# Patient Record
Sex: Male | Born: 1975 | Race: White | Hispanic: No | Smoking: Current every day smoker
Health system: Southern US, Community
[De-identification: ages and names within clinical notes are randomized; demographics above are authoritative.]

## PROBLEM LIST (undated history)

## (undated) DIAGNOSIS — G709 Myoneural disorder, unspecified: Secondary | ICD-10-CM

## (undated) DIAGNOSIS — G4730 Sleep apnea, unspecified: Secondary | ICD-10-CM

## (undated) DIAGNOSIS — I10 Essential (primary) hypertension: Secondary | ICD-10-CM

## (undated) DIAGNOSIS — K219 Gastro-esophageal reflux disease without esophagitis: Secondary | ICD-10-CM

## (undated) DIAGNOSIS — M1990 Unspecified osteoarthritis, unspecified site: Secondary | ICD-10-CM

## (undated) HISTORY — PX: MYRINGOTOMY WITH TUBE PLACEMENT: SHX5663

## (undated) HISTORY — PX: TONSILLECTOMY: SUR1361

## (undated) HISTORY — PX: ADENOIDECTOMY: SUR15

---

## 2006-02-07 ENCOUNTER — Encounter: Admission: RE | Admit: 2006-02-07 | Discharge: 2006-02-07 | Payer: Self-pay | Admitting: Unknown Physician Specialty

## 2013-02-17 ENCOUNTER — Other Ambulatory Visit: Payer: Self-pay | Admitting: Orthopedic Surgery

## 2013-02-17 DIAGNOSIS — M25561 Pain in right knee: Secondary | ICD-10-CM

## 2013-02-22 ENCOUNTER — Ambulatory Visit
Admission: RE | Admit: 2013-02-22 | Discharge: 2013-02-22 | Disposition: A | Discharge status: Home / Self Care | Payer: 59 | Source: Ambulatory Visit | Attending: Orthopedic Surgery | Admitting: Orthopedic Surgery

## 2013-02-22 DIAGNOSIS — M25561 Pain in right knee: Secondary | ICD-10-CM

## 2013-02-27 ENCOUNTER — Other Ambulatory Visit: Payer: Self-pay | Admitting: Orthopedic Surgery

## 2013-02-27 ENCOUNTER — Encounter (HOSPITAL_COMMUNITY): Payer: Self-pay | Admitting: *Deleted

## 2013-03-02 ENCOUNTER — Encounter (HOSPITAL_COMMUNITY)
Admission: RE | Disposition: A | Discharge status: Home / Self Care | Payer: Self-pay | Source: Ambulatory Visit | Attending: Orthopedic Surgery

## 2013-03-02 ENCOUNTER — Ambulatory Visit (HOSPITAL_COMMUNITY)
Admission: RE | Admit: 2013-03-02 | Discharge: 2013-03-02 | Disposition: A | Discharge status: Home / Self Care | Payer: 59 | Source: Ambulatory Visit | Attending: Orthopedic Surgery | Admitting: Orthopedic Surgery

## 2013-03-02 ENCOUNTER — Encounter (HOSPITAL_COMMUNITY): Payer: Self-pay | Admitting: *Deleted

## 2013-03-02 ENCOUNTER — Ambulatory Visit (HOSPITAL_COMMUNITY): Payer: 59

## 2013-03-02 ENCOUNTER — Ambulatory Visit (HOSPITAL_COMMUNITY): Payer: 59 | Admitting: Anesthesiology

## 2013-03-02 ENCOUNTER — Encounter (HOSPITAL_COMMUNITY): Payer: Self-pay | Admitting: Anesthesiology

## 2013-03-02 DIAGNOSIS — X58XXXA Exposure to other specified factors, initial encounter: Secondary | ICD-10-CM | POA: Insufficient documentation

## 2013-03-02 DIAGNOSIS — Y9301 Activity, walking, marching and hiking: Secondary | ICD-10-CM | POA: Insufficient documentation

## 2013-03-02 DIAGNOSIS — Z6841 Body Mass Index (BMI) 40.0 and over, adult: Secondary | ICD-10-CM | POA: Insufficient documentation

## 2013-03-02 DIAGNOSIS — M2240 Chondromalacia patellae, unspecified knee: Secondary | ICD-10-CM | POA: Insufficient documentation

## 2013-03-02 DIAGNOSIS — M1710 Unilateral primary osteoarthritis, unspecified knee: Secondary | ICD-10-CM | POA: Insufficient documentation

## 2013-03-02 DIAGNOSIS — IMO0002 Reserved for concepts with insufficient information to code with codable children: Secondary | ICD-10-CM | POA: Insufficient documentation

## 2013-03-02 DIAGNOSIS — Z9889 Other specified postprocedural states: Secondary | ICD-10-CM

## 2013-03-02 DIAGNOSIS — I10 Essential (primary) hypertension: Secondary | ICD-10-CM | POA: Insufficient documentation

## 2013-03-02 HISTORY — PX: KNEE ARTHROSCOPY: SHX127

## 2013-03-02 HISTORY — DX: Sleep apnea, unspecified: G47.30

## 2013-03-02 HISTORY — DX: Unspecified osteoarthritis, unspecified site: M19.90

## 2013-03-02 HISTORY — DX: Gastro-esophageal reflux disease without esophagitis: K21.9

## 2013-03-02 HISTORY — DX: Essential (primary) hypertension: I10

## 2013-03-02 HISTORY — DX: Myoneural disorder, unspecified: G70.9

## 2013-03-02 LAB — CBC
HCT: 44.2 % (ref 39.0–52.0)
Hemoglobin: 14.8 g/dL (ref 13.0–17.0)
RBC: 4.96 MIL/uL (ref 4.22–5.81)
RDW: 14.2 % (ref 11.5–15.5)
WBC: 8.4 10*3/uL (ref 4.0–10.5)

## 2013-03-02 LAB — BASIC METABOLIC PANEL
BUN: 13 mg/dL (ref 6–23)
Chloride: 104 mEq/L (ref 96–112)
Creatinine, Ser: 0.86 mg/dL (ref 0.50–1.35)
GFR calc Af Amer: >90 mL/min (ref >90)
Potassium: 4 mEq/L (ref 3.5–5.1)
Sodium: 138 mEq/L (ref 135–145)

## 2013-03-02 SURGERY — ARTHROSCOPY, KNEE
Anesthesia: General | Site: Knee | Laterality: Right | Wound class: Clean

## 2013-03-02 MED ORDER — PROPOFOL 10 MG/ML IV BOLUS
INTRAVENOUS | Status: DC | PRN
  Administered 2013-03-02: 80 mg via INTRAVENOUS
  Administered 2013-03-02: 400 mg via INTRAVENOUS

## 2013-03-02 MED ORDER — OXYCODONE HCL 5 MG/5ML PO SOLN: 5.0000 mg | Freq: Once | ORAL | Status: AC | PRN

## 2013-03-02 MED ORDER — HYDROCODONE-ACETAMINOPHEN 5-325 MG PO TABS
ORAL_TABLET | ORAL | Status: AC
  Filled 2013-03-02: qty 2

## 2013-03-02 MED ORDER — LIDOCAINE HCL (CARDIAC) 20 MG/ML IV SOLN
INTRAVENOUS | Status: DC | PRN
  Administered 2013-03-02: 100 mg via INTRAVENOUS

## 2013-03-02 MED ORDER — ROCURONIUM BROMIDE 100 MG/10ML IV SOLN
INTRAVENOUS | Status: DC | PRN
  Administered 2013-03-02: 20 mg via INTRAVENOUS

## 2013-03-02 MED ORDER — LACTATED RINGERS IV SOLN
INTRAVENOUS | Status: DC
  Administered 2013-03-02: 10:00:00 via INTRAVENOUS

## 2013-03-02 MED ORDER — NEOSTIGMINE METHYLSULFATE 1 MG/ML IJ SOLN
INTRAMUSCULAR | Status: DC | PRN
  Administered 2013-03-02: 3 mg via INTRAVENOUS

## 2013-03-02 MED ORDER — MIDAZOLAM HCL 5 MG/5ML IJ SOLN
INTRAMUSCULAR | Status: DC | PRN
  Administered 2013-03-02: 2 mg via INTRAVENOUS

## 2013-03-02 MED ORDER — LACTATED RINGERS IV SOLN
INTRAVENOUS | Status: DC | PRN
  Administered 2013-03-02 (×2): via INTRAVENOUS

## 2013-03-02 MED ORDER — GLYCOPYRROLATE 0.2 MG/ML IJ SOLN
INTRAMUSCULAR | Status: DC | PRN
  Administered 2013-03-02: 0.4 mg via INTRAVENOUS

## 2013-03-02 MED ORDER — ONDANSETRON HCL 4 MG/2ML IJ SOLN
INTRAMUSCULAR | Status: DC | PRN
  Administered 2013-03-02: 4 mg via INTRAVENOUS

## 2013-03-02 MED ORDER — ONDANSETRON HCL 4 MG/2ML IJ SOLN: 4.0000 mg | Freq: Once | INTRAMUSCULAR | Status: DC | PRN

## 2013-03-02 MED ORDER — SODIUM CHLORIDE 0.9 % IR SOLN
Status: DC | PRN
  Administered 2013-03-02 (×2): 3000 mL

## 2013-03-02 MED ORDER — CHLORHEXIDINE GLUCONATE 4 % EX LIQD: 60.0000 mL | Freq: Once | CUTANEOUS | Status: DC

## 2013-03-02 MED ORDER — FENTANYL CITRATE 0.05 MG/ML IJ SOLN
INTRAMUSCULAR | Status: DC | PRN
  Administered 2013-03-02: 150 ug via INTRAVENOUS
  Administered 2013-03-02 (×2): 50 ug via INTRAVENOUS

## 2013-03-02 MED ORDER — HYDROCODONE-ACETAMINOPHEN 7.5-325 MG PO TABS: 1.0000 | ORAL_TABLET | Freq: Four times a day (QID) | ORAL | Status: DC | PRN

## 2013-03-02 MED ORDER — OXYCODONE HCL 5 MG PO TABS
ORAL_TABLET | ORAL | Status: AC
  Filled 2013-03-02: qty 1

## 2013-03-02 MED ORDER — HYDROCODONE-ACETAMINOPHEN 5-325 MG PO TABS
2.0000 | ORAL_TABLET | ORAL | Status: AC
  Administered 2013-03-02: 2 via ORAL

## 2013-03-02 MED ORDER — SUCCINYLCHOLINE CHLORIDE 20 MG/ML IJ SOLN
INTRAMUSCULAR | Status: DC | PRN
  Administered 2013-03-02: 200 mg via INTRAVENOUS

## 2013-03-02 MED ORDER — HYDROMORPHONE HCL PF 1 MG/ML IJ SOLN: 0.2500 mg | INTRAMUSCULAR | Status: DC | PRN

## 2013-03-02 MED ORDER — OXYCODONE HCL 5 MG PO TABS
5.0000 mg | ORAL_TABLET | Freq: Once | ORAL | Status: AC | PRN
  Administered 2013-03-02: 5 mg via ORAL

## 2013-03-02 MED ORDER — BUPIVACAINE-EPINEPHRINE 0.5% -1:200000 IJ SOLN
INTRAMUSCULAR | Status: DC | PRN
  Administered 2013-03-02: 15 mL

## 2013-03-02 SURGICAL SUPPLY — 50 items
BANDAGE ELASTIC 6 VELCRO ST LF (GAUZE/BANDAGES/DRESSINGS) ×2 IMPLANT
BANDAGE ESMARK 6X9 LF (GAUZE/BANDAGES/DRESSINGS) IMPLANT
BLADE CUDA 5.5 (BLADE) IMPLANT
BLADE CUTTER GATOR 3.5 (BLADE) IMPLANT
BLADE GREAT WHITE 4.2 (BLADE) ×2 IMPLANT
BNDG CMPR 9X6 STRL LF SNTH (GAUZE/BANDAGES/DRESSINGS)
BNDG COHESIVE 6X5 TAN NS LF (GAUZE/BANDAGES/DRESSINGS) ×1 IMPLANT
BNDG ESMARK 6X9 LF (GAUZE/BANDAGES/DRESSINGS)
BUR OVAL 6.0 (BURR) IMPLANT
CLOTH BEACON ORANGE TIMEOUT ST (SAFETY) ×1 IMPLANT
CUFF TOURNIQUET SINGLE 34IN LL (TOURNIQUET CUFF) IMPLANT
DRAPE ARTHROSCOPY W/POUCH 114 (DRAPES) ×2 IMPLANT
DRAPE U-SHAPE 47X51 STRL (DRAPES) IMPLANT
ELECT CAUTERY BLADE 6.4 (BLADE) IMPLANT
ELECT MENISCUS 165MM 90D (ELECTRODE) IMPLANT
ELECT REM PT RETURN 9FT ADLT (ELECTROSURGICAL)
ELECTRODE REM PT RTRN 9FT ADLT (ELECTROSURGICAL) IMPLANT
GAUZE XEROFORM 1X8 LF (GAUZE/BANDAGES/DRESSINGS) ×2 IMPLANT
GLOVE BIOGEL M 7.0 STRL (GLOVE) ×2 IMPLANT
GLOVE BIOGEL PI IND STRL 7.0 (GLOVE) ×1 IMPLANT
GLOVE BIOGEL PI IND STRL 7.5 (GLOVE) IMPLANT
GLOVE BIOGEL PI IND STRL 8.5 (GLOVE) ×1 IMPLANT
GLOVE BIOGEL PI INDICATOR 7.0 (GLOVE) ×1
GLOVE BIOGEL PI INDICATOR 7.5 (GLOVE) ×2
GLOVE BIOGEL PI INDICATOR 8.5 (GLOVE) ×1
GLOVE ECLIPSE 6.5 STRL STRAW (GLOVE) ×1 IMPLANT
GLOVE SURG ORTHO 8.0 STRL STRW (GLOVE) ×3 IMPLANT
GOWN BRE IMP SLV AUR XL STRL (GOWN DISPOSABLE) ×4 IMPLANT
GOWN SRG XL XLNG 56XLVL 4 (GOWN DISPOSABLE) IMPLANT
GOWN STRL NON-REIN LRG LVL3 (GOWN DISPOSABLE) ×3 IMPLANT
GOWN STRL NON-REIN XL XLG LVL4 (GOWN DISPOSABLE) ×4
KIT ROOM TURNOVER OR (KITS) ×2 IMPLANT
MANIFOLD NEPTUNE II (INSTRUMENTS) ×2 IMPLANT
NDL 18GX1X1/2 (RX/OR ONLY) (NEEDLE) ×1 IMPLANT
NEEDLE 18GX1X1/2 (RX/OR ONLY) (NEEDLE) ×2 IMPLANT
PACK ARTHROSCOPY DSU (CUSTOM PROCEDURE TRAY) ×2 IMPLANT
PAD ARMBOARD 7.5X6 YLW CONV (MISCELLANEOUS) ×4 IMPLANT
PADDING CAST COTTON 6X4 STRL (CAST SUPPLIES) ×1 IMPLANT
PENCIL BUTTON HOLSTER BLD 10FT (ELECTRODE) IMPLANT
SET ARTHROSCOPY TUBING (MISCELLANEOUS) ×2
SET ARTHROSCOPY TUBING LN (MISCELLANEOUS) ×1 IMPLANT
SPONGE GAUZE 4X4 12PLY (GAUZE/BANDAGES/DRESSINGS) ×2 IMPLANT
SPONGE LAP 4X18 X RAY DECT (DISPOSABLE) ×2 IMPLANT
SUT ETHILON 4 0 PS 2 18 (SUTURE) ×2 IMPLANT
SYR 30ML LL (SYRINGE) ×4 IMPLANT
TOWEL OR 17X24 6PK STRL BLUE (TOWEL DISPOSABLE) ×2 IMPLANT
TOWEL OR 17X26 10 PK STRL BLUE (TOWEL DISPOSABLE) ×2 IMPLANT
TUBE CONNECTING 12X1/4 (SUCTIONS) ×2 IMPLANT
WAND 90 DEG TURBOVAC W/CORD (SURGICAL WAND) ×1 IMPLANT
WATER STERILE IRR 1000ML POUR (IV SOLUTION) ×2 IMPLANT

## 2013-03-02 NOTE — Transfer of Care (Signed)
Immediate Anesthesia Transfer of Care Note  Patient: Juan Dixon  Procedure(s) Performed: Procedure(s): ARTHROSCOPY KNEE (Right)  Patient Location: PACU  Anesthesia Type:General  Level of Consciousness: awake, alert , oriented and patient cooperative  Airway & Oxygen Therapy: Patient Spontanous Breathing and Patient connected to nasal cannula oxygen  Post-op Assessment: Report given to PACU RN and Post -op Vital signs reviewed and stable  Post vital signs: Reviewed  Complications: No apparent anesthesia complications

## 2013-03-02 NOTE — Anesthesia Preprocedure Evaluation (Addendum)
Anesthesia Evaluation  Patient identified by MRN, date of birth, ID band Patient awake    Reviewed: Allergy & Precautions, H&P , NPO status , Patient's Chart, lab work & pertinent test results  History of Anesthesia Complications Negative for: history of anesthetic complications  Airway Mallampati: II TM Distance: >3 FB Neck ROM: Full    Dental  (+) Teeth Intact and Dental Advisory Given   Pulmonary sleep apnea and Continuous Positive Airway Pressure Ventilation , Current Smoker,  1 ppd x 20 years breath sounds clear to auscultation        Cardiovascular hypertension, Pt. on medications Rhythm:Regular Rate:Normal     Neuro/Psych    GI/Hepatic Neg liver ROS, GERD-  Controlled,  Endo/Other  Morbid obesity  Renal/GU negative Renal ROS     Musculoskeletal   Abdominal   Peds  Hematology negative hematology ROS (+)   Anesthesia Other Findings   Reproductive/Obstetrics negative OB ROS                          Anesthesia Physical Anesthesia Plan  ASA: III  Anesthesia Plan: General   Post-op Pain Management:    Induction: Intravenous  Airway Management Planned: Oral ETT  Additional Equipment:   Intra-op Plan:   Post-operative Plan: Extubation in OR  Informed Consent: I have reviewed the patients History and Physical, chart, labs and discussed the procedure including the risks, benefits and alternatives for the proposed anesthesia with the patient or authorized representative who has indicated his/her understanding and acceptance.   Dental advisory given  Plan Discussed with: CRNA, Anesthesiologist and Surgeon  Anesthesia Plan Comments:         Anesthesia Quick Evaluation

## 2013-03-02 NOTE — Preoperative (Signed)
Beta Blockers   Reason not to administer Beta Blockers:Not Applicable 

## 2013-03-02 NOTE — Anesthesia Procedure Notes (Signed)
Procedure Name: Intubation Date/Time: 03/02/2013 12:11 PM Performed by: Lovie Chol Pre-anesthesia Checklist: Patient identified, Emergency Drugs available, Suction available, Patient being monitored and Timeout performed Patient Re-evaluated:Patient Re-evaluated prior to inductionOxygen Delivery Method: Circle system utilized Preoxygenation: Pre-oxygenation with 100% oxygen Intubation Type: IV induction Ventilation: Mask ventilation without difficulty Laryngoscope Size: Miller and 2 Grade View: Grade I Tube type: Oral Tube size: 7.5 mm Number of attempts: 1 Airway Equipment and Method: Stylet Placement Confirmation: ETT inserted through vocal cords under direct vision,  positive ETCO2,  CO2 detector and breath sounds checked- equal and bilateral Secured at: 23 cm Tube secured with: Tape Dental Injury: Teeth and Oropharynx as per pre-operative assessment

## 2013-03-02 NOTE — Anesthesia Postprocedure Evaluation (Signed)
  Anesthesia Post-op Note  Patient: Juan Dixon  Procedure(s) Performed: Procedure(s): ARTHROSCOPY KNEE (Right)  Patient Location: PACU  Anesthesia Type:General  Level of Consciousness: awake, alert  and oriented  Airway and Oxygen Therapy: Patient Spontanous Breathing and Patient connected to nasal cannula oxygen  Post-op Pain: mild  Post-op Assessment: Post-op Vital signs reviewed  Post-op Vital Signs: Reviewed  Complications: No apparent anesthesia complications

## 2013-03-02 NOTE — H&P (Signed)
NAME: Juan Dixon MRN:   [id number] DOB:   1976/04/13     HISTORY AND PHYSICAL  CHIEF COMPLAINT:  Right knee pain  HISTORY:   Juan Dixon a 37 y.o. male  with right  Knee Pain Patient presents with a knewe injury involving the right knee. Onset of the symptoms was several years ago. Inciting event: injured while walking, injured during a fall while walking. Current symptoms include giving out, locking, stiffness and swelling. Pain is aggravated by any weight bearing. Patient has had prior knee problems. Evaluation to date: plain films: abnormal OA for age and MRI: abnormal meniscal tear. Treatment to date: avoidance of offending activity, brace which is somewhat effective, corticosteroid injection which was somewhat effective, ice, OTC analgesics which are somewhat effective and prescription NSAIDS which are somewhat effective. Plan for right knee scope.    PAST MEDICAL HISTORY:   Past Medical History  Diagnosis Date  . Hypertension   . Sleep apnea     sleep study done 02/2011  . Diabetes mellitus without complication 2011    on Prednisone, caused Bld sugar to go up, was put on Metformin, no longer having issues, checks BS occ.  Marland Kitchen GERD (gastroesophageal reflux disease)     takes otc meds as needed  . Neuromuscular disorder     pinched nerve, had tingling in fingers. seeing chiropractor  . Arthritis     knees    PAST SURGICAL HISTORY:   Past Surgical History  Procedure Laterality Date  . Tonsillectomy    . Adenoidectomy    . Myringotomy with tube placement      MEDICATIONS:   No prescriptions prior to admission    ALLERGIES:   Allergies  Allergen Reactions  . Avelox [Moxifloxacin Hcl In Nacl] Hives  . Prednisone Other (See Comments)    Caused him to have temporary diabetes    REVIEW OF SYSTEMS:   Negative except HPI  FAMILY HISTORY:  History reviewed. No pertinent family history.  SOCIAL HISTORY:   reports that he has been smoking.  He has quit using smokeless  tobacco. He reports that  drinks alcohol. He reports that he does not use illicit drugs.  PHYSICAL EXAM:  General appearance: alert, cooperative, no distress and morbidly obese Resp: clear to auscultation bilaterally Cardio: regular rate and rhythm, S1, S2 normal, no murmur, click, rub or gallop GI: soft, non-tender; bowel sounds normal; no masses,  no organomegaly Extremities: extremities normal, atraumatic, no cyanosis or edema and Homans sign is negative, no sign of DVT Pulses: 2+ and symmetric Skin: Skin color, texture, turgor normal. No rashes or lesions Neurologic: Alert and oriented X 3, normal strength and tone. Normal symmetric reflexes. Normal coordination and gait    LABORATORY STUDIES: No results found for this basename: WBC, HGB, HCT, PLT,  in the last 72 hours  No results found for this basename: NA, K, CL, CO2, GLUCOSE, BUN, CREATININE, CALCIUM,  in the last 72 hours  STUDIES/RESULTS:  Mr Knee Right Wo Contrast  02/22/2013   CLINICAL DATA:  Right knee pain.  EXAM: MRI OF THE RIGHT KNEE WITHOUT CONTRAST  TECHNIQUE: Multiplanar, multisequence MR imaging of the knee was performed. No intravenous contrast was administered.  COMPARISON:  Radiographs 02/10/2013.  FINDINGS: MENISCI  Medial meniscus: Severely torn/macerated posterior horn and mid body regions. Intra substance degenerative changes in the anterior horn  Lateral meniscus:  Intact.  Mild degenerative changes.  LIGAMENTS  Cruciates: Severe mucoid degeneration versus more likely chronic ACL tear. The PCL  is intact with mild mucoid degeneration.  Collaterals:  Intact. Moderate MCL and pes anserine bursitis.  CARTILAGE  Patellofemoral: Significant degenerative chondrosis involving the medial patellar fat sat.  Medial:  Moderate degenerative chondrosis.  Lateral:  Moderate degenerative chondrosis.  Joint:  Small joint effusion with moderate synovial thickening.  Popliteal Fossa:  No popliteal mass or Baker's cyst  Extensor Mechanism:  The patella retinacular structures are intact. There is moderate lateral subluxation of the patella. The quadriceps and patellar tendons are intact.  Bones: No acute bony findings. Moderate tricompartmental degenerative changes but no marrow edema or osteochondral lesion.  IMPRESSION: Severely degenerated and torn medial meniscus.  Severe mucoid degeneration of the ACL versus more likely chronic ACL tear.  Tricompartmental degenerative changes.  Moderate lateral subluxation of the patella.  Small joint effusion with moderate synovial thickening.  MCL and pes anserine bursitis.   Electronically Signed   By: Loralie Champagne M.D.   On: 02/22/2013 16:11    ASSESSMENT:  End stage osteoarthritis right knee, meniscal tear        Active Problems:   * No active hospital problems. *    PLAN:  Right knee scope   Ceana Fiala 03/02/2013. 7:10 AM

## 2013-03-02 NOTE — Progress Notes (Signed)
Orthopedic Tech Progress Note Patient Details:  Juan Dixon 11/12/1975 [id number]  Ortho Devices Type of Ortho Device: Crutches Ortho Device/Splint Interventions: Application Viewed order from doctor's order list  Nikki Dom 03/02/2013, 1:22 PM

## 2013-03-03 ENCOUNTER — Encounter (HOSPITAL_COMMUNITY): Payer: Self-pay | Admitting: Orthopedic Surgery

## 2013-03-03 NOTE — Op Note (Signed)
Dictation Number:  [id number]

## 2013-03-04 NOTE — Op Note (Signed)
Juan Dixon, Juan Dixon                ACCOUNT NO.:  [id number]  MEDICAL RECORD NO.:  [id number]  LOCATION:  [facility]                         FACILITY:  [facility]  PHYSICIAN:  Mila Homer. Sherlean Foot, M.D. DATE OF BIRTH:  [birth year]  DATE OF PROCEDURE:  03/02/2013 DATE OF DISCHARGE:  03/02/2013                              OPERATIVE REPORT   SURGEON:  Mila Homer. Sherlean Foot, M.D.  ASSISTANTLaural Benes. Su Hilt, PA-C.  ANESTHESIA:  General.  PREOPERATIVE DIAGNOSES:  Right knee medial meniscal tear.  POSTOP DIAGNOSIS:  Right knee medial meniscal tear.  PROCEDURE:  Right knee arthroscopy with partial medial meniscectomy.  INDICATION FOR PROCEDURE:  The patient is a 37 year old super morbidly obese patient, which necessitated inpatient __________.  Informed consent obtained.  DESCRIPTION OF PROCEDURE:  The patient was laid supine under general anesthesia, The right leg was prepped and draped in usual sterile fashion.  The lateral inferomedial borders were created with __________. Diagnostic arthroscopy revealed minimal chondromalacia in the patellofemoral joint, moderate in the medial compartment __________ medial meniscus.  ACL and PCL appeared relatively normal.  The ACL look a little injured and scarred down.  The PCL was offering some stability and I definitely left that.  Lateral compartment was normal.  I then entered the medial compartment with 10 degrees of flexion and valgus force applied to the knee.  I performed a partial medial meniscectomy with straight basket forceps and __________ shaver.  I then washed __________ sutures, dressed with Xeroform, dressing sponges, sterile Webril, Ace wrap.  COMPLICATIONS:  None.  DRAINS:  None.          ______________________________ Mila Homer. Sherlean Foot, M.D.     SDL/MEDQ  D:  03/03/2013  T:  03/03/2013  Job:  [id number]

## 2014-04-13 ENCOUNTER — Other Ambulatory Visit: Payer: Self-pay | Admitting: Orthopedic Surgery

## 2014-04-13 DIAGNOSIS — R609 Edema, unspecified: Secondary | ICD-10-CM

## 2014-04-13 DIAGNOSIS — R52 Pain, unspecified: Secondary | ICD-10-CM

## 2014-04-18 ENCOUNTER — Ambulatory Visit
Admission: RE | Admit: 2014-04-18 | Discharge: 2014-04-18 | Disposition: A | Discharge status: Home / Self Care | Payer: 59 | Source: Ambulatory Visit | Attending: Orthopedic Surgery | Admitting: Orthopedic Surgery

## 2014-04-18 DIAGNOSIS — R52 Pain, unspecified: Secondary | ICD-10-CM

## 2014-04-18 DIAGNOSIS — R609 Edema, unspecified: Secondary | ICD-10-CM

## 2014-04-27 ENCOUNTER — Other Ambulatory Visit: Payer: Self-pay | Admitting: Orthopedic Surgery

## 2014-05-05 ENCOUNTER — Encounter (HOSPITAL_COMMUNITY)
Admission: RE | Admit: 2014-05-05 | Discharge: 2014-05-05 | Disposition: A | Discharge status: Home / Self Care | Payer: 59 | Source: Ambulatory Visit | Attending: Orthopedic Surgery | Admitting: Orthopedic Surgery

## 2014-05-05 ENCOUNTER — Encounter (HOSPITAL_COMMUNITY): Payer: Self-pay

## 2014-05-05 DIAGNOSIS — I10 Essential (primary) hypertension: Secondary | ICD-10-CM | POA: Diagnosis not present

## 2014-05-05 DIAGNOSIS — Z6841 Body Mass Index (BMI) 40.0 and over, adult: Secondary | ICD-10-CM | POA: Insufficient documentation

## 2014-05-05 DIAGNOSIS — I4519 Other right bundle-branch block: Secondary | ICD-10-CM | POA: Insufficient documentation

## 2014-05-05 DIAGNOSIS — E119 Type 2 diabetes mellitus without complications: Secondary | ICD-10-CM | POA: Insufficient documentation

## 2014-05-05 DIAGNOSIS — G4733 Obstructive sleep apnea (adult) (pediatric): Secondary | ICD-10-CM | POA: Insufficient documentation

## 2014-05-05 DIAGNOSIS — Z01818 Encounter for other preprocedural examination: Secondary | ICD-10-CM | POA: Insufficient documentation

## 2014-05-05 DIAGNOSIS — K219 Gastro-esophageal reflux disease without esophagitis: Secondary | ICD-10-CM | POA: Diagnosis not present

## 2014-05-05 LAB — CBC
HEMATOCRIT: 43.2 % (ref 39.0–52.0)
HEMOGLOBIN: 14.5 g/dL (ref 13.0–17.0)
MCH: 30.2 pg (ref 26.0–34.0)
MCHC: 33.6 g/dL (ref 30.0–36.0)
MCV: 90 fL (ref 78.0–100.0)
Platelets: 148 10*3/uL — ABNORMAL LOW (ref 150–400)
RBC: 4.8 MIL/uL (ref 4.22–5.81)
RDW: 14.5 % (ref 11.5–15.5)
WBC: 7.9 10*3/uL (ref 4.0–10.5)

## 2014-05-05 LAB — BASIC METABOLIC PANEL
ANION GAP: 12 (ref 5–15)
BUN: 15 mg/dL (ref 6–23)
CHLORIDE: 103 meq/L (ref 96–112)
CO2: 24 meq/L (ref 19–32)
Calcium: 9.1 mg/dL (ref 8.4–10.5)
Creatinine, Ser: 0.83 mg/dL (ref 0.50–1.35)
GFR calc Af Amer: >90 mL/min (ref >90)
GFR calc non Af Amer: >90 mL/min (ref >90)
Glucose, Bld: 115 mg/dL — ABNORMAL HIGH (ref 70–99)
POTASSIUM: 4.1 meq/L (ref 3.7–5.3)
Sodium: 139 mEq/L (ref 137–147)

## 2014-05-05 NOTE — Pre-Procedure Instructions (Addendum)
Juan BatheJason Dixon  05/05/2014   Your procedure is scheduled on:  05/17/14  [facility] at 815 AM.  Call this number if you have problems the morning of surgery: [phone]   Remember:   Do not eat food or drink liquids after midnight.   Take these medicines the morning of surgery with A SIP OF WATER: amlodipine, pain med if needed,inhaler(bring with you)  Take meds as ordered until day of surgery except as instructed below or per dr  Despina AriasSTOP all herbel meds, nsaids (aleve,naproxen,advil,ibuprofen) 5 days prior to surgery(05/12/14) including aspirin, vitamins   Do not wear jewelry, make-up or nail polish.  Do not wear lotions, powders, or perfumes. You may wear deodorant.  Do not shave 48 hours prior to surgery. Men may shave face and neck.  Do not bring valuables to the hospital.  [facility] is not responsible                  for any belongings or valuables.               Contacts, dentures or bridgework may not be worn into surgery.  Leave suitcase in the car. After surgery it may be brought to your room.  For patients admitted to the hospital, discharge time is determined by your                treatment team.               Patients discharged the day of surgery will not be allowed to drive  home.  Name and phone number of your driver:   Special Instructions:  Special Instructions: [place] - Preparing for Surgery  Before surgery, you can play an important role.  Because skin is not sterile, your skin needs to be as free of germs as possible.  You can reduce the number of germs on you skin by washing with CHG (chlorahexidine gluconate) soap before surgery.  CHG is an antiseptic cleaner which kills germs and bonds with the skin to continue killing germs even after washing.  Please DO NOT use if you have an allergy to CHG or antibacterial soaps.  If your skin becomes reddened/irritated stop using the CHG and inform your nurse when you arrive at Short  Stay.  Do not shave (including legs and underarms) for at least 48 hours prior to the first CHG shower.  You may shave your face.  Please follow these instructions carefully:   1.  Shower with CHG Soap the night before surgery and the morning of Surgery.  2.  If you choose to wash your hair, wash your hair first as usual with your normal shampoo.  3.  After you shampoo, rinse your hair and body thoroughly to remove the Shampoo.  4.  Use CHG as you would any other liquid soap.  You can apply chg directly  to the skin and wash gently with scrungie or a clean washcloth.  5.  Apply the CHG Soap to your body ONLY FROM THE NECK DOWN.  Do not use on open wounds or open sores.  Avoid contact with your eyes ears, mouth and genitals (private parts).  Wash genitals (private parts)       with your normal soap.  6.  Wash thoroughly, paying special attention to the area where your surgery will be performed.  7.  Thoroughly rinse your body with warm water from the neck down.  8.  DO NOT  shower/wash with your normal soap after using and rinsing off the CHG Soap.  9.  Pat yourself dry with a clean towel.            10.  Wear clean pajamas.            11.  Place clean sheets on your bed the night of your first shower and do not sleep with pets.  Day of Surgery  Do not apply any lotions/deodorants the morning of surgery.  Please wear clean clothes to the hospital/surgery center.   Please read over the following fact sheets that you were given: Pain Booklet, Coughing and Deep Breathing and Surgical Site Infection Prevention

## 2014-05-05 NOTE — Progress Notes (Signed)
Anesthesia Chart Review:  Pt is 38 year old male scheduled for L knee arthroscopy on 05/17/2014 with Dr. Sherlean FootLucey.   PMH: HTN, DM, OSA, GERD. S/p knee arthroscopy on 03/03/2013. BMI 57  Preoperative labs reviewed.    EKG: NSR. Incomplete RBBB. Appears stable when compared with EKG dated 03/02/2013.   If no changes, I anticipate pt can proceed with surgery as scheduled.   Rica Mastngela Raven Harmes, FNP-BC [facility]/Anesthesiology Phone: [phone] 05/05/2014 2:31 PM

## 2014-05-16 MED ORDER — DEXTROSE 5 % IV SOLN
3.0000 g | INTRAVENOUS | Status: AC
  Administered 2014-05-17: 3 g via INTRAVENOUS
  Filled 2014-05-16: qty 3000

## 2014-05-17 ENCOUNTER — Ambulatory Visit (HOSPITAL_COMMUNITY): Payer: 59 | Admitting: Emergency Medicine

## 2014-05-17 ENCOUNTER — Encounter (HOSPITAL_COMMUNITY): Payer: Self-pay | Admitting: *Deleted

## 2014-05-17 ENCOUNTER — Ambulatory Visit (HOSPITAL_COMMUNITY)
Admission: RE | Admit: 2014-05-17 | Discharge: 2014-05-17 | Disposition: A | Discharge status: Home / Self Care | Payer: 59 | Source: Ambulatory Visit | Attending: Orthopedic Surgery | Admitting: Orthopedic Surgery

## 2014-05-17 ENCOUNTER — Ambulatory Visit (HOSPITAL_COMMUNITY): Payer: 59 | Admitting: Anesthesiology

## 2014-05-17 ENCOUNTER — Encounter (HOSPITAL_COMMUNITY)
Admission: RE | Disposition: A | Discharge status: Home / Self Care | Payer: Self-pay | Source: Ambulatory Visit | Attending: Orthopedic Surgery

## 2014-05-17 DIAGNOSIS — Y929 Unspecified place or not applicable: Secondary | ICD-10-CM | POA: Diagnosis not present

## 2014-05-17 DIAGNOSIS — M179 Osteoarthritis of knee, unspecified: Secondary | ICD-10-CM | POA: Diagnosis not present

## 2014-05-17 DIAGNOSIS — G4730 Sleep apnea, unspecified: Secondary | ICD-10-CM | POA: Insufficient documentation

## 2014-05-17 DIAGNOSIS — K219 Gastro-esophageal reflux disease without esophagitis: Secondary | ICD-10-CM | POA: Diagnosis not present

## 2014-05-17 DIAGNOSIS — S83242A Other tear of medial meniscus, current injury, left knee, initial encounter: Secondary | ICD-10-CM | POA: Diagnosis not present

## 2014-05-17 DIAGNOSIS — I10 Essential (primary) hypertension: Secondary | ICD-10-CM | POA: Diagnosis not present

## 2014-05-17 DIAGNOSIS — E119 Type 2 diabetes mellitus without complications: Secondary | ICD-10-CM | POA: Diagnosis not present

## 2014-05-17 DIAGNOSIS — X58XXXA Exposure to other specified factors, initial encounter: Secondary | ICD-10-CM | POA: Diagnosis not present

## 2014-05-17 DIAGNOSIS — Z888 Allergy status to other drugs, medicaments and biological substances status: Secondary | ICD-10-CM | POA: Diagnosis not present

## 2014-05-17 DIAGNOSIS — Z882 Allergy status to sulfonamides status: Secondary | ICD-10-CM | POA: Diagnosis not present

## 2014-05-17 DIAGNOSIS — F17200 Nicotine dependence, unspecified, uncomplicated: Secondary | ICD-10-CM | POA: Diagnosis not present

## 2014-05-17 DIAGNOSIS — M25569 Pain in unspecified knee: Secondary | ICD-10-CM | POA: Diagnosis present

## 2014-05-17 HISTORY — PX: KNEE ARTHROSCOPY: SHX127

## 2014-05-17 SURGERY — ARTHROSCOPY, KNEE
Anesthesia: General | Site: Knee | Laterality: Left

## 2014-05-17 MED ORDER — LACTATED RINGERS IV SOLN
INTRAVENOUS | Status: DC
  Administered 2014-05-17: 10:00:00 via INTRAVENOUS

## 2014-05-17 MED ORDER — PROPOFOL 10 MG/ML IV BOLUS
INTRAVENOUS | Status: AC
  Filled 2014-05-17: qty 20

## 2014-05-17 MED ORDER — OXYCODONE HCL 5 MG PO TABS
5.0000 mg | ORAL_TABLET | Freq: Once | ORAL | Status: AC | PRN
  Administered 2014-05-17: 5 mg via ORAL

## 2014-05-17 MED ORDER — OXYCODONE HCL 5 MG PO TABS
ORAL_TABLET | ORAL | Status: AC
  Filled 2014-05-17: qty 1

## 2014-05-17 MED ORDER — OXYCODONE HCL 5 MG/5ML PO SOLN: 5.0000 mg | Freq: Once | ORAL | Status: AC | PRN

## 2014-05-17 MED ORDER — CHLORHEXIDINE GLUCONATE 4 % EX LIQD
60.0000 mL | Freq: Once | CUTANEOUS | Status: DC
  Filled 2014-05-17: qty 60

## 2014-05-17 MED ORDER — MIDAZOLAM HCL 2 MG/2ML IJ SOLN
INTRAMUSCULAR | Status: AC
  Filled 2014-05-17: qty 2

## 2014-05-17 MED ORDER — HYDROCODONE-ACETAMINOPHEN 7.5-325 MG PO TABS: 1.0000 | ORAL_TABLET | Freq: Four times a day (QID) | ORAL | Status: AC | PRN

## 2014-05-17 MED ORDER — ONDANSETRON HCL 4 MG/2ML IJ SOLN
INTRAMUSCULAR | Status: AC
  Filled 2014-05-17: qty 2

## 2014-05-17 MED ORDER — LIDOCAINE HCL (CARDIAC) 20 MG/ML IV SOLN
INTRAVENOUS | Status: AC
  Filled 2014-05-17: qty 5

## 2014-05-17 MED ORDER — FENTANYL CITRATE 0.05 MG/ML IJ SOLN
INTRAMUSCULAR | Status: AC
  Filled 2014-05-17: qty 5

## 2014-05-17 MED ORDER — HYDROMORPHONE HCL 1 MG/ML IJ SOLN
INTRAMUSCULAR | Status: AC
  Filled 2014-05-17: qty 1

## 2014-05-17 MED ORDER — ONDANSETRON HCL 4 MG/2ML IJ SOLN: 4.0000 mg | Freq: Four times a day (QID) | INTRAMUSCULAR | Status: DC | PRN

## 2014-05-17 MED ORDER — FENTANYL CITRATE 0.05 MG/ML IJ SOLN: 50.0000 ug | INTRAMUSCULAR | Status: AC | PRN

## 2014-05-17 MED ORDER — HYDROMORPHONE HCL 1 MG/ML IJ SOLN
INTRAMUSCULAR | Status: AC
  Administered 2014-05-17: 0.5 mg via INTRAVENOUS
  Filled 2014-05-17: qty 1

## 2014-05-17 MED ORDER — HYDROMORPHONE HCL 1 MG/ML IJ SOLN
0.2500 mg | INTRAMUSCULAR | Status: DC | PRN
  Administered 2014-05-17 (×4): 0.5 mg via INTRAVENOUS

## 2014-05-17 MED ORDER — PROPOFOL 10 MG/ML IV BOLUS
INTRAVENOUS | Status: DC | PRN
  Administered 2014-05-17: 20 mg via INTRAVENOUS
  Administered 2014-05-17: 180 mg via INTRAVENOUS
  Administered 2014-05-17: 50 mg via INTRAVENOUS

## 2014-05-17 MED ORDER — IBUPROFEN 200 MG PO TABS: 600.0000 mg | ORAL_TABLET | Freq: Four times a day (QID) | ORAL | Status: AC | PRN

## 2014-05-17 MED ORDER — FENTANYL CITRATE 0.05 MG/ML IJ SOLN
INTRAMUSCULAR | Status: AC
  Filled 2014-05-17: qty 2

## 2014-05-17 MED ORDER — ROCURONIUM BROMIDE 50 MG/5ML IV SOLN
INTRAVENOUS | Status: AC
  Filled 2014-05-17: qty 1

## 2014-05-17 MED ORDER — LIDOCAINE HCL (CARDIAC) 20 MG/ML IV SOLN
INTRAVENOUS | Status: DC | PRN
  Administered 2014-05-17: 80 mg via INTRAVENOUS

## 2014-05-17 MED ORDER — ONDANSETRON HCL 4 MG/2ML IJ SOLN
INTRAMUSCULAR | Status: DC | PRN
  Administered 2014-05-17: 4 mg via INTRAVENOUS

## 2014-05-17 MED ORDER — FENTANYL CITRATE 0.05 MG/ML IJ SOLN: 50.0000 ug | INTRAMUSCULAR | Status: DC | PRN

## 2014-05-17 MED ORDER — MIDAZOLAM HCL 2 MG/2ML IJ SOLN: 1.0000 mg | INTRAMUSCULAR | Status: DC | PRN

## 2014-05-17 MED ORDER — IBUPROFEN 200 MG PO TABS
800.0000 mg | ORAL_TABLET | Freq: Three times a day (TID) | ORAL | Status: AC
Start: 2014-05-17 — End: ?

## 2014-05-17 MED ORDER — SODIUM CHLORIDE 0.9 % IR SOLN
Status: DC | PRN
  Administered 2014-05-17: 6000 mL

## 2014-05-17 MED ORDER — FENTANYL CITRATE 0.05 MG/ML IJ SOLN
INTRAMUSCULAR | Status: DC | PRN
  Administered 2014-05-17 (×2): 50 ug via INTRAVENOUS

## 2014-05-17 MED ORDER — MIDAZOLAM HCL 5 MG/5ML IJ SOLN
INTRAMUSCULAR | Status: DC | PRN
  Administered 2014-05-17: 2 mg via INTRAVENOUS

## 2014-05-17 SURGICAL SUPPLY — 32 items
BANDAGE ELASTIC 6 VELCRO ST LF (GAUZE/BANDAGES/DRESSINGS) ×3 IMPLANT
BLADE GREAT WHITE 4.2 (BLADE) ×2 IMPLANT
BLADE GREAT WHITE 4.2MM (BLADE) ×1
DRAPE ARTHROSCOPY W/POUCH 114 (DRAPES) ×3 IMPLANT
DRAPE U-SHAPE 47X51 STRL (DRAPES) ×3 IMPLANT
DRSG PAD ABDOMINAL 8X10 ST (GAUZE/BANDAGES/DRESSINGS) ×2 IMPLANT
ELECT REM PT RETURN 9FT ADLT (ELECTROSURGICAL)
ELECTRODE REM PT RTRN 9FT ADLT (ELECTROSURGICAL) IMPLANT
GAUZE SPONGE 4X4 12PLY STRL (GAUZE/BANDAGES/DRESSINGS) ×3 IMPLANT
GAUZE XEROFORM 1X8 LF (GAUZE/BANDAGES/DRESSINGS) ×3 IMPLANT
GLOVE BIOGEL PI IND STRL 8.5 (GLOVE) ×2 IMPLANT
GLOVE BIOGEL PI INDICATOR 8.5 (GLOVE) ×4
GLOVE SURG ORTHO 8.0 STRL STRW (GLOVE) ×6 IMPLANT
GOWN STRL REUS W/ TWL LRG LVL3 (GOWN DISPOSABLE) ×1 IMPLANT
GOWN STRL REUS W/TWL LRG LVL3 (GOWN DISPOSABLE) ×3
GOWN STRL REUS W/TWL XL LVL3 (GOWN DISPOSABLE) ×6 IMPLANT
KIT ROOM TURNOVER OR (KITS) ×3 IMPLANT
MANIFOLD NEPTUNE II (INSTRUMENTS) ×3 IMPLANT
PACK ARTHROSCOPY DSU (CUSTOM PROCEDURE TRAY) ×3 IMPLANT
PAD ARMBOARD 7.5X6 YLW CONV (MISCELLANEOUS) ×6 IMPLANT
PADDING CAST COTTON 6X4 STRL (CAST SUPPLIES) ×2 IMPLANT
PENCIL BUTTON HOLSTER BLD 10FT (ELECTRODE) IMPLANT
SET ARTHROSCOPY TUBING (MISCELLANEOUS) ×3
SET ARTHROSCOPY TUBING LN (MISCELLANEOUS) ×1 IMPLANT
SPONGE GAUZE 4X4 12PLY STER LF (GAUZE/BANDAGES/DRESSINGS) ×2 IMPLANT
SPONGE LAP 4X18 X RAY DECT (DISPOSABLE) ×3 IMPLANT
SUT ETHILON 4 0 PS 2 18 (SUTURE) ×3 IMPLANT
SYR 30ML LL (SYRINGE) ×6 IMPLANT
TOWEL OR 17X24 6PK STRL BLUE (TOWEL DISPOSABLE) ×3 IMPLANT
TOWEL OR 17X26 10 PK STRL BLUE (TOWEL DISPOSABLE) ×3 IMPLANT
WAND HAND CNTRL MULTIVAC 90 (MISCELLANEOUS) IMPLANT
WATER STERILE IRR 1000ML POUR (IV SOLUTION) ×3 IMPLANT

## 2014-05-17 NOTE — Transfer of Care (Signed)
Immediate Anesthesia Transfer of Care Note  Patient: Juan BatheJason Colonna  Procedure(s) Performed: Procedure(s): ARTHROSCOPY KNEE (Left)  Patient Location: PACU  Anesthesia Type:General  Level of Consciousness: awake, alert  and oriented  Airway & Oxygen Therapy: Patient Spontanous Breathing and Patient connected to face mask oxygen  Post-op Assessment: Report given to PACU RN, Post -op Vital signs reviewed and stable and Patient moving all extremities X 4  Post vital signs: Reviewed and stable  Complications: No apparent anesthesia complications

## 2014-05-17 NOTE — Anesthesia Preprocedure Evaluation (Addendum)
Anesthesia Evaluation  Patient identified by MRN, date of birth, ID band Patient awake    Reviewed: Allergy & Precautions, H&P , NPO status , Patient's Chart, lab work & pertinent test results  Airway Mallampati: I   Neck ROM: full    Dental  (+) Teeth Intact   Pulmonary sleep apnea , Current Smoker,          Cardiovascular hypertension,     Neuro/Psych  Neuromuscular disease    GI/Hepatic GERD-  ,  Endo/Other  Morbid obesity  Renal/GU      Musculoskeletal  (+) Arthritis -,   Abdominal   Peds  Hematology   Anesthesia Other Findings   Reproductive/Obstetrics                            Anesthesia Physical Anesthesia Plan  ASA: II  Anesthesia Plan: General   Post-op Pain Management:    Induction: Intravenous  Airway Management Planned: LMA  Additional Equipment:   Intra-op Plan:   Post-operative Plan:   Informed Consent: I have reviewed the patients History and Physical, chart, labs and discussed the procedure including the risks, benefits and alternatives for the proposed anesthesia with the patient or authorized representative who has indicated his/her understanding and acceptance.     Plan Discussed with: CRNA, Anesthesiologist and Surgeon  Anesthesia Plan Comments:         Anesthesia Quick Evaluation

## 2014-05-17 NOTE — Anesthesia Procedure Notes (Signed)
Procedure Name: LMA Insertion Date/Time: 05/17/2014 11:02 AM Performed by: Sharlene DoryWALKER, Orlean Holtrop E Pre-anesthesia Checklist: Patient identified, Emergency Drugs available, Suction available, Patient being monitored and Timeout performed Patient Re-evaluated:Patient Re-evaluated prior to inductionOxygen Delivery Method: Circle system utilized Preoxygenation: Pre-oxygenation with 100% oxygen Intubation Type: IV induction Ventilation: Mask ventilation without difficulty LMA: LMA inserted LMA Size: 5.0 Number of attempts: 1 Placement Confirmation: positive ETCO2 and breath sounds checked- equal and bilateral Tube secured with: Tape Dental Injury: Teeth and Oropharynx as per pre-operative assessment

## 2014-05-17 NOTE — Progress Notes (Signed)
Received report from Teresa RN

## 2014-05-17 NOTE — Anesthesia Postprocedure Evaluation (Signed)
Anesthesia Post Note  Patient: Juan BatheJason Dixon  Procedure(s) Performed: Procedure(s) (LRB): ARTHROSCOPY KNEE (Left)  Anesthesia type: General  Patient location: PACU  Post pain: Pain level controlled and Adequate analgesia  Post assessment: Post-op Vital signs reviewed, Patient's Cardiovascular Status Stable, Respiratory Function Stable, Patent Airway and Pain level controlled  Last Vitals:  Filed Vitals:   05/17/14 1222  BP: 133/55  Pulse: 66  Temp:   Resp: 13    Post vital signs: Reviewed and stable  Level of consciousness: awake, alert  and oriented  Complications: No apparent anesthesia complications

## 2014-05-17 NOTE — Discharge Instructions (Signed)

## 2014-05-17 NOTE — H&P (Signed)
  Juan Dixon MRN:  [id number] DOB/SEX:  July 22, 1975/male  CHIEF COMPLAINT:  Painful left Knee  HISTORY: Patient is a 38 y.o. male presented with a history of pain in the left knee. Onset of symptoms was abrupt starting several weeks ago with rapidly worsening course since that time. Prior procedures on the knee include none. Patient has been treated conservatively with over-the-counter NSAIDs and activity modification. Patient currently rates pain in the knee at 7 out of 10 with activity. There is pain at night.  PAST MEDICAL HISTORY: There are no active problems to display for this patient.  Past Medical History  Diagnosis Date  . Hypertension   . Sleep apnea     sleep study done 02/2011  . Neuromuscular disorder     pinched nerve, had tingling in fingers. seeing chiropractor  . Arthritis     knees  . Diabetes mellitus without complication 2011    no rx at present  prednisone caused sugar problem 3-4 yrs ago  . GERD (gastroesophageal reflux disease)     takes otc meds as needed   Past Surgical History  Procedure Laterality Date  . Tonsillectomy    . Adenoidectomy    . Myringotomy with tube placement    . Knee arthroscopy Right 03/02/2013    Procedure: ARTHROSCOPY KNEE;  Surgeon: Dannielle HuhSteve Lucey, MD;  [facility];  Service: Orthopedics;  Laterality: Right;     MEDICATIONS:   No prescriptions prior to admission    ALLERGIES:   Allergies  Allergen Reactions  . Avelox [Moxifloxacin Hcl In Nacl] Hives  . Prednisone Other (See Comments)    Caused him to have temporary diabetes  . Sulfa Antibiotics Itching    REVIEW OF SYSTEMS:  A comprehensive review of systems was negative.   FAMILY HISTORY:  No family history on file.  SOCIAL HISTORY:   History  Substance Use Topics  . Smoking status: Current Every Day Smoker -- 1.00 packs/day for 20 years    Types: Cigarettes  . Smokeless tobacco: Former NeurosurgeonUser  . Alcohol Use: Yes     Comment: occ. use      EXAMINATION:  Vital signs in last 24 hours:    General appearance: alert, cooperative and no distress Lungs: clear to auscultation bilaterally Heart: regular rate and rhythm, S1, S2 normal, no murmur, click, rub or gallop Abdomen: soft, non-tender; bowel sounds normal; no masses,  no organomegaly Extremities: extremities normal, atraumatic, no cyanosis or edema and Homans sign is negative, no sign of DVT Pulses: 2+ and symmetric Skin: Skin color, texture, turgor normal. No rashes or lesions Neurologic: Alert and oriented X 3, normal strength and tone. Normal symmetric reflexes. Normal coordination and gait  Musculoskeletal:  ROM 0-120, Ligaments intact,  Imaging Review Plain radiographs demonstrate mild degenerative joint disease of the left knee. The overall alignment is neutral. The bone quality appears to be good for age and reported activity level.  Assessment/Plan: Left knee meniscal tear  The patient history, physical examination and imaging studies are consistent with left medial meniscal tear The patient has failed conservative treatment.  The clearance notes were reviewed.  After discussion with the patient it was felt that Knee Arthroscopy was indicated. The procedure,  risks, and benefits of total knee arthroplasty were presented and reviewed. The patient acknowledged the explanation, agreed to proceed with the plan.  Juan Dixon 05/17/2014, 6:31 AM

## 2014-05-18 ENCOUNTER — Encounter (HOSPITAL_COMMUNITY): Payer: Self-pay | Admitting: Orthopedic Surgery

## 2014-05-18 NOTE — Op Note (Signed)
NAMDebbrah Alar:  Salzman, Jered                ACCOUNT NO.:  [id number]  MEDICAL RECORD NO.:  [id number]  LOCATION:  [facility]                         FACILITY:  [facility]  PHYSICIAN:  Mila HomerStephen D. Sherlean FootLucey, M.D. DATE OF BIRTH:  November 18, 1975  DATE OF PROCEDURE:  05/17/2014 DATE OF DISCHARGE:  05/17/2014                              OPERATIVE REPORT   SURGEON:  Mila HomerStephen D. Sherlean FootLucey, M.D.  ASSISTANT:  Altamese CabalMaurice Jones, PA-C  ANESTHESIA:  General.  PREOPERATIVE DIAGNOSIS:  Left knee medial meniscus tear.  POSTOPERATIVE DIAGNOSIS:  Left knee medial meniscus tear.  PROCEDURE:  Left knee arthroscopy with partial medial meniscectomy.  INDICATION FOR PROCEDURE:  The patient is a 38 year old white male with mechanical symptoms, MRI evidence of meniscus tear.  Informed consent was obtained.  DESCRIPTION OF PROCEDURE:  The patient was laid supine and administered general anesthesia.  The left knee was prepped and draped in usual fashion.  Inferolateral and inferomedial portals were created with a #11 blade, blunt trocar and cannula.  Diagnostic arthroscopy revealed grade 2 chondromalacia in the patellofemoral joint on the medial compartment, had a degenerative torn medial meniscus, which was flipped up into the gutter, delivered it back into the joint and then resected with the great White shaver and straight basket forceps.  Then, went to the notch.  The ACL and PCL were normal.  One of the lateral compartment, there was some degenerative changes there, that had cleaned up with a straight basket forceps as well.  Removed the debris with great White shaver.  I then lavaged, closed with 4-0 nylon sutures.  Dressed with Xeroform, dressing sponges, sterile Webril, and Ace wrap.  COMPLICATION:  None.  DRAINS:  None.          ______________________________ Mila HomerStephen D. Sherlean FootLucey, M.D.    SDL/MEDQ  D:  05/18/2014  T:  05/18/2014  Job:  [id number]

## 2014-05-18 NOTE — Op Note (Signed)
Dictation Number: [id number]

## 2015-02-02 ENCOUNTER — Emergency Department (HOSPITAL_COMMUNITY): Payer: 59

## 2015-02-02 ENCOUNTER — Emergency Department (HOSPITAL_COMMUNITY)
Admission: EM | Admit: 2015-02-02 | Discharge: 2015-02-02 | Disposition: A | Discharge status: Home / Self Care | Payer: 59 | Attending: Emergency Medicine | Admitting: Emergency Medicine

## 2015-02-02 ENCOUNTER — Encounter (HOSPITAL_COMMUNITY): Payer: Self-pay

## 2015-02-02 DIAGNOSIS — R519 Headache, unspecified: Secondary | ICD-10-CM

## 2015-02-02 DIAGNOSIS — R109 Unspecified abdominal pain: Secondary | ICD-10-CM | POA: Diagnosis present

## 2015-02-02 DIAGNOSIS — R51 Headache: Secondary | ICD-10-CM | POA: Insufficient documentation

## 2015-02-02 DIAGNOSIS — I10 Essential (primary) hypertension: Secondary | ICD-10-CM | POA: Insufficient documentation

## 2015-02-02 DIAGNOSIS — Z720 Tobacco use: Secondary | ICD-10-CM | POA: Insufficient documentation

## 2015-02-02 LAB — URINALYSIS, ROUTINE W REFLEX MICROSCOPIC
BILIRUBIN URINE: NEGATIVE
Glucose, UA: NEGATIVE mg/dL
Ketones, ur: NEGATIVE mg/dL
Leukocytes, UA: NEGATIVE
NITRITE: NEGATIVE
PROTEIN: NEGATIVE mg/dL
Specific Gravity, Urine: 1.018 (ref 1.005–1.030)
UROBILINOGEN UA: 0.2 mg/dL (ref 0.0–1.0)
pH: 8 (ref 5.0–8.0)

## 2015-02-02 LAB — COMPREHENSIVE METABOLIC PANEL
ALK PHOS: 86 U/L (ref 38–126)
ALT: 21 U/L (ref 17–63)
ANION GAP: 9 (ref 5–15)
AST: 23 U/L (ref 15–41)
Albumin: 3.8 g/dL (ref 3.5–5.0)
BUN: 11 mg/dL (ref 6–20)
CALCIUM: 9.1 mg/dL (ref 8.9–10.3)
CO2: 27 mmol/L (ref 22–32)
Chloride: 99 mmol/L — ABNORMAL LOW (ref 101–111)
Creatinine, Ser: 0.95 mg/dL (ref 0.61–1.24)
GFR calc non Af Amer: >60 mL/min (ref >60)
Glucose, Bld: 105 mg/dL — ABNORMAL HIGH (ref 65–99)
POTASSIUM: 4.5 mmol/L (ref 3.5–5.1)
SODIUM: 135 mmol/L (ref 135–145)
TOTAL PROTEIN: 7.1 g/dL (ref 6.5–8.1)
Total Bilirubin: 0.8 mg/dL (ref 0.3–1.2)

## 2015-02-02 LAB — CBC WITH DIFFERENTIAL/PLATELET
BASOS PCT: 0 % (ref 0–1)
Basophils Absolute: 0 10*3/uL (ref 0.0–0.1)
EOS ABS: 0 10*3/uL (ref 0.0–0.7)
EOS PCT: 0 % (ref 0–5)
HCT: 41.9 % (ref 39.0–52.0)
HEMOGLOBIN: 14.5 g/dL (ref 13.0–17.0)
LYMPHS ABS: 1.9 10*3/uL (ref 0.7–4.0)
Lymphocytes Relative: 15 % (ref 12–46)
MCH: 29.8 pg (ref 26.0–34.0)
MCHC: 34.6 g/dL (ref 30.0–36.0)
MCV: 86 fL (ref 78.0–100.0)
MONO ABS: 0.5 10*3/uL (ref 0.1–1.0)
MONOS PCT: 4 % (ref 3–12)
NEUTROS PCT: 81 % — AB (ref 43–77)
Neutro Abs: 10.6 10*3/uL — ABNORMAL HIGH (ref 1.7–7.7)
Platelets: 171 10*3/uL (ref 150–400)
RBC: 4.87 MIL/uL (ref 4.22–5.81)
RDW: 13.9 % (ref 11.5–15.5)
WBC: 13.1 10*3/uL — ABNORMAL HIGH (ref 4.0–10.5)

## 2015-02-02 LAB — LIPASE, BLOOD: Lipase: 17 U/L — ABNORMAL LOW (ref 22–51)

## 2015-02-02 LAB — ACETAMINOPHEN LEVEL

## 2015-02-02 LAB — URINE MICROSCOPIC-ADD ON

## 2015-02-02 MED ORDER — METOCLOPRAMIDE HCL 5 MG/ML IJ SOLN
10.0000 mg | INTRAMUSCULAR | Status: AC
  Administered 2015-02-02: 10 mg via INTRAVENOUS
  Filled 2015-02-02: qty 2

## 2015-02-02 MED ORDER — DIPHENHYDRAMINE HCL 25 MG PO TABS: 25.0000 mg | ORAL_TABLET | Freq: Four times a day (QID) | ORAL | Status: AC | PRN

## 2015-02-02 MED ORDER — METOCLOPRAMIDE HCL 10 MG PO TABS: 10.0000 mg | ORAL_TABLET | Freq: Four times a day (QID) | ORAL | Status: AC

## 2015-02-02 MED ORDER — SODIUM CHLORIDE 0.9 % IV BOLUS (SEPSIS)
1000.0000 mL | Freq: Once | INTRAVENOUS | Status: AC
  Administered 2015-02-02: 1000 mL via INTRAVENOUS

## 2015-02-02 MED ORDER — DIPHENHYDRAMINE HCL 50 MG/ML IJ SOLN
25.0000 mg | Freq: Once | INTRAMUSCULAR | Status: AC
  Administered 2015-02-02: 25 mg via INTRAVENOUS
  Filled 2015-02-02: qty 1

## 2015-02-02 NOTE — Discharge Instructions (Signed)
May continue reglan/benadryl every 6 hours as needed for headache. May also wish to try decongestant to help with nasal congestion. Referrals to ENT and neurology given-- strongly recommend follow-up if continue having recurrent symptoms. Return here for any new or worsening symptoms such as visual disturbance, confusion, changes in speech, facial droop, numbness, or weakness.

## 2015-02-02 NOTE — ED Provider Notes (Signed)
CSN: [id number]     Arrival date & time 02/02/15  1731 History   First MD Initiated Contact with Patient 02/02/15 1735     Chief Complaint  Patient presents with  . Migraine  . Abdominal Pain     (Consider location/radiation/quality/duration/timing/severity/associated sxs/prior Treatment) Patient is a 39 y.o. male presenting with migraines and abdominal pain. The history is provided by the patient and medical records.  Migraine Associated symptoms include abdominal pain and headaches.  Abdominal Pain  This is a 39 y.o. M with hx of HTN, presenting to the ED for abdominal pain and migraine headache.  Patient states migraine began approx 2 weeks ago, has been intermittent since this time. He reports headache seems to worsen when he is up exerting energy, better with lying still. He has been seen at [facility] and [facility] urgent care.  He states there was some initial concern for RMSF versus Lyme disease, however had negative titers for both. Patient is still on doxycycline. He states he has concern that there is something wrong with his brain. He denies any fever, chills, or neck pain. No dizziness, visual disturbance, confusion, changes in speech, numbness, weakness, or falls. Patient has no history of migraines. No family history of migraine headaches. Patient is not currently on any type of anticoagulation. Patient states he was given hydrocodone, percocet, and 800mg  motrin which helps reduce headache, however never fully resolves.  Patient reports today he has had some nausea but denies vomiting.  Some constipation since he began taking narcotic medications.  He has continued eating and drinking normally, freely passing flatus.  No hx of abdominal surgeries in the past.  Patient states he is here today because he wants a "concrete diagnosis" in regards to his headache.  VSS.   Past Medical History  Diagnosis Date  . Hypertension    History reviewed. No pertinent past surgical  history. History reviewed. No pertinent family history. Social History  Substance Use Topics  . Smoking status: Current Every Day Smoker  . Smokeless tobacco: None  . Alcohol Use: Yes    Review of Systems  Gastrointestinal: Positive for abdominal pain.  Neurological: Positive for headaches.  All other systems reviewed and are negative.     Allergies  Review of patient's allergies indicates not on file.  Home Medications   Prior to Admission medications   Not on File   BP 151/82 mmHg  Pulse 49  Temp(Src) 98.5 F (36.9 C) (Oral)  SpO2 97%   Physical Exam  Constitutional: He is oriented to person, place, and time. He appears well-developed and well-nourished.  Morbidly obese  HENT:  Head: Normocephalic and atraumatic.  Mouth/Throat: Oropharynx is clear and moist.  Eyes: Conjunctivae and EOM are normal. Pupils are equal, round, and reactive to light.  Neck: Normal range of motion and full passive range of motion without pain. Neck supple. No spinous process tenderness and no muscular tenderness present. No rigidity. No Brudzinski's sign and no Kernig's sign noted.  Neck non-tender; full ROM maintained, able to fully put chin on chest; no rigidity; no meningeal signs  Cardiovascular: Normal rate, regular rhythm and normal heart sounds.   Pulmonary/Chest: Effort normal and breath sounds normal.  Abdominal: Soft. Bowel sounds are normal. There is no tenderness. There is no rigidity and no guarding.  Obese abdomen but does not appear distended; soft; non-tender; no peritoneal signs; normal bowel sounds; no rebound or guarding  Musculoskeletal: Normal range of motion.  Neurological: He is alert and  oriented to person, place, and time.  AAOx3, answering questions appropriately; equal strength UE and LE bilaterally; CN grossly intact; moves all extremities appropriately without ataxia; no focal neuro deficits or facial asymmetry appreciated  Skin: Skin is warm and dry.   Psychiatric: He has a normal mood and affect.  Nursing note and vitals reviewed.   ED Course  Procedures (including critical care time) Labs Review Labs Reviewed  CBC WITH DIFFERENTIAL/PLATELET - Abnormal; Notable for the following:    WBC 13.1 (*)    Neutrophils Relative % 81 (*)    Neutro Abs 10.6 (*)    All other components within normal limits  COMPREHENSIVE METABOLIC PANEL - Abnormal; Notable for the following:    Chloride 99 (*)    Glucose, Bld 105 (*)    All other components within normal limits  LIPASE, BLOOD - Abnormal; Notable for the following:    Lipase 17 (*)    All other components within normal limits  URINALYSIS, ROUTINE W REFLEX MICROSCOPIC (NOT AT [facility]) - Abnormal; Notable for the following:    Hgb urine dipstick SMALL (*)    All other components within normal limits  ACETAMINOPHEN LEVEL - Abnormal; Notable for the following:    Acetaminophen (Tylenol), Serum <10 (*)    All other components within normal limits  URINE MICROSCOPIC-ADD ON - Abnormal; Notable for the following:    Casts GRANULAR CAST (*)    All other components within normal limits    Imaging Review Ct Head Wo Contrast  02/02/2015   CLINICAL DATA:  Persistent headache for 2 weeks  EXAM: CT HEAD WITHOUT CONTRAST  TECHNIQUE: Contiguous axial images were obtained from the base of the skull through the vertex without intravenous contrast.  COMPARISON:  None.  FINDINGS: No acute cortical infarct, hemorrhage, or mass lesion ispresent. Ventricles are of normal size. No significant extra-axial fluid collection is present. Mild mucosal thickening involving the ethmoid air cells and maxillary sinus is identified. The mastoid air cells are clear. The osseous skull is intact.  IMPRESSION: 1. No acute intracranial abnormalities. 2. Mild mucosal thickening involving the ethmoid air cells and maxillary sinuses.   Electronically Signed   By: Signa Kell M.D.   On: 02/02/2015 18:51   I have personally reviewed  and evaluated these images and lab results as part of my medical decision-making.   EKG Interpretation None      MDM   Final diagnoses:  Headache, unspecified headache type  Abdominal pain, unspecified abdominal location   39 year old male here with 2 weeks of intermittent headache and new abdominal pain.  He seems much more concerned about his headache vs abdominal pain. Patient is afebrile, nontoxic. His neurologic exam is non-focal. His neck is nontender, full range of motion, no rigidity and no meningeal signs.  Patient has no personal or family history of migraines. Headache has been unrelieved by narcotics.  At this time, I do have low suspicion for acute intracranial pathology, however given persistent symptoms and no history of the same, will obtain head CT as well as labs regarding abdominal pain. Abdomen is soft, non-surgical and i do not suspect acute abdomen, discomfort possibly due to constipation from narcotics. Patient given IV fluid bolus, Benadryl, and Reglan. Will reassess.  After IV medications, patient states he feeling much better.  He states he is actually comfortable for the first time in 2 weeks.  Labwork is overall reassuring, slight leukocytosis which is nonspecific. No clinical signs of infection. CT head without acute  findings, mucosal thickening of the ethmoid and maxillary sinuses. Results were discussed with patient, he does admit that he has been having sinus congestion for several weeks. He does use CPAP nightly. He has not been taking any type of decongestant. He now reports that headache is worse in the morning upon waking and slightly improves throughout the day. His headaches may be partially due to his sinus congestion. I continue to have low suspicion for acute intracranial pathology including ICH, SAH, TIA, stroke, or meningitis. I had a long discussion with patient that we are unlikely to pinpoint the exact etiology of his headache here in the emergency  department, however there does not appear to be any any life-threatening abnormalities noted today-- patient acknowledged understanding and is comfortable with discharge home at this time. I have given him referrals to neurology as well as ENT.  Scripts for reglan and benadryl given, also recommended a decongestant to help with nasal congestion.  Discussed plan with patient, he/she acknowledged understanding and agreed with plan of care.  Return precautions given for new or worsening symptoms.  Garlon Hatchet, PA-C 02/02/15 2346  Garlon Hatchet, PA-C 02/02/15 2347  Samuel Jester, DO 02/04/15 1356

## 2015-02-02 NOTE — ED Notes (Signed)
Pt complaining of migraine x 2 weeks. Also complaining of epigastric pain, nausea, and generalized weakness x 1 week. Denies any trauma/injury. States percocet helps some, but no relief.

## 2015-02-03 ENCOUNTER — Encounter (HOSPITAL_COMMUNITY): Payer: Self-pay | Admitting: Orthopedic Surgery

## 2016-07-03 IMAGING — MR MR KNEE*L* W/O CM
4 of 6 series · 21 of 40 positions shown · non-contrast
Comparison: None.

CLINICAL DATA: Injured knee 1 week ago stepping in a hole and
twisting knee. No history of prior surgery.

EXAM:
MRI OF THE LEFT KNEE WITHOUT CONTRAST
TECHNIQUE: Multiplanar, multisequence MR imaging of the knee was performed. No
intravenous contrast was administered.

[Series 4: pd_tse_fs_tra · axial · 3.5mm · 0.42mm/px · z∈[+29,+116]mm · 3 of 26 slices shown]
[im 6/26]
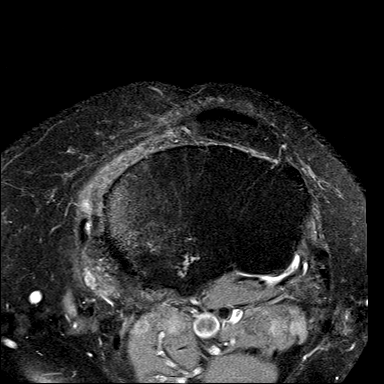
[im 16/26]
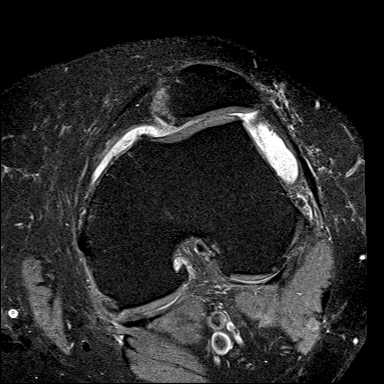
[im 26/26]
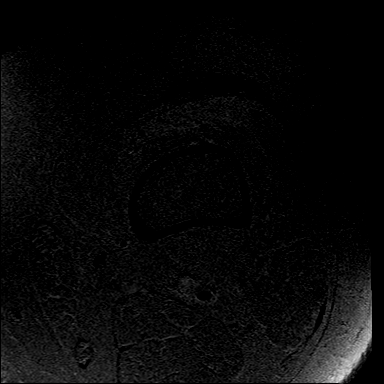

[Series 6: PD fat-sat · sagittal · 3.5mm · 0.27mm/px · 7 of 27 slices shown]
[im 1/27]
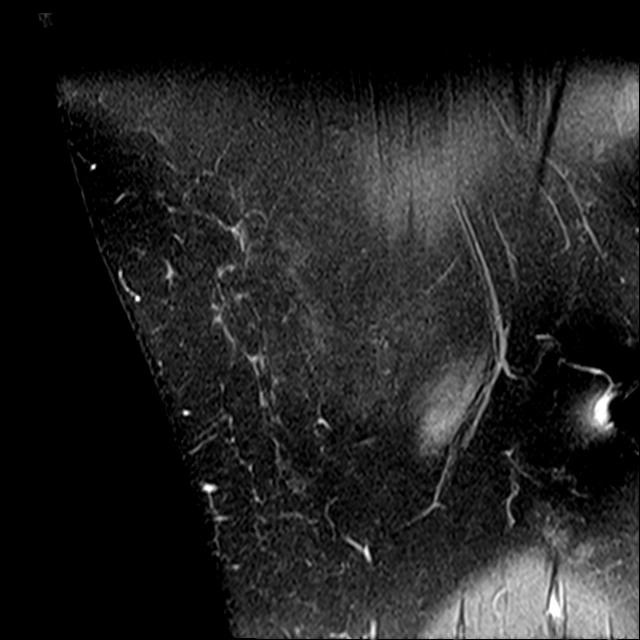
[im 5/27]
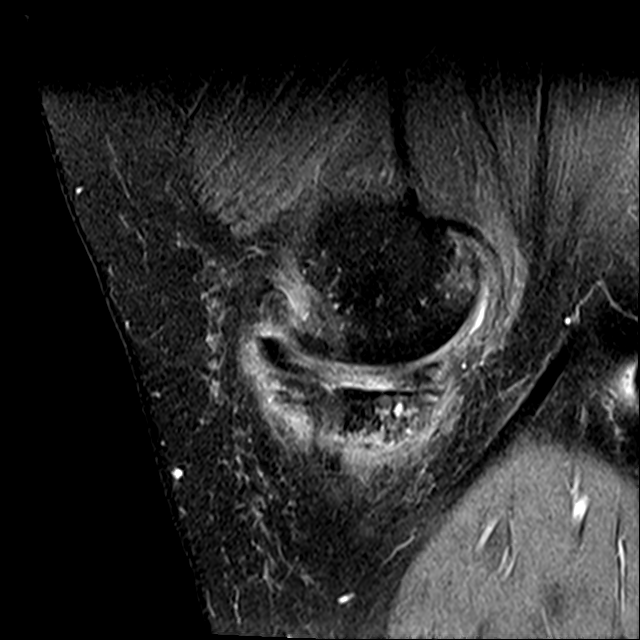
[im 9/27]
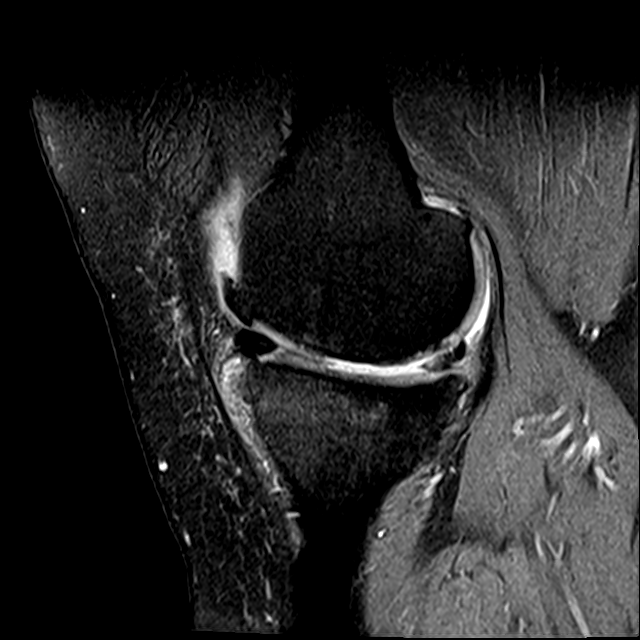
[im 14/27]
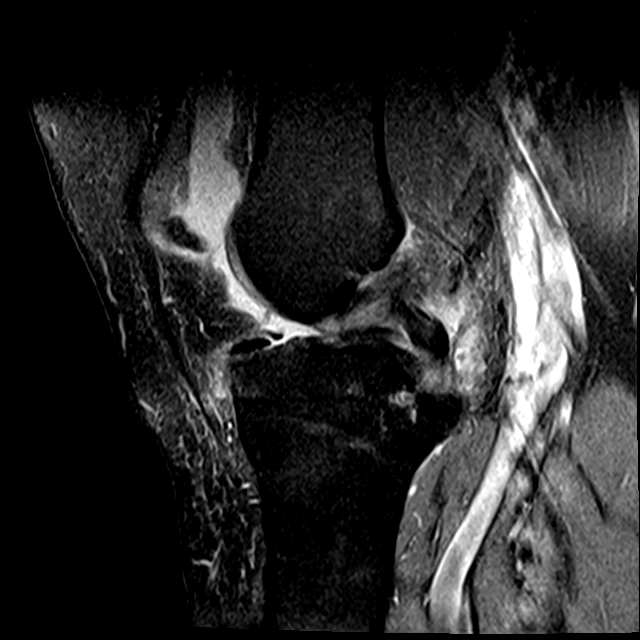
[im 18/27]
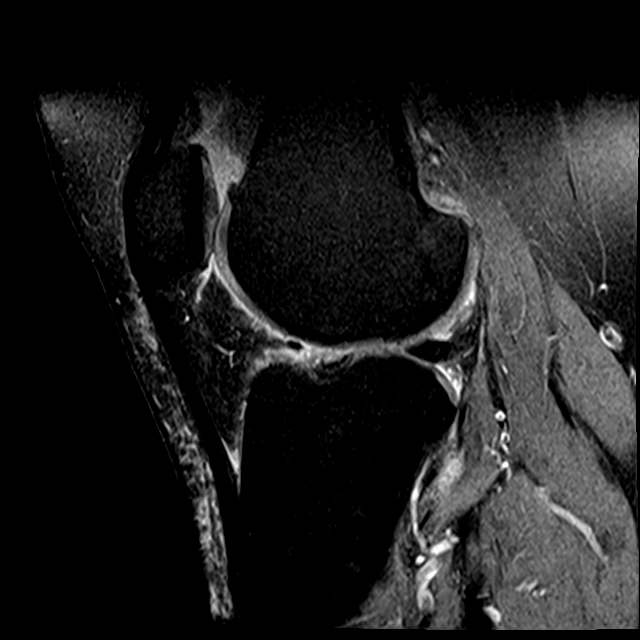
[im 22/27]
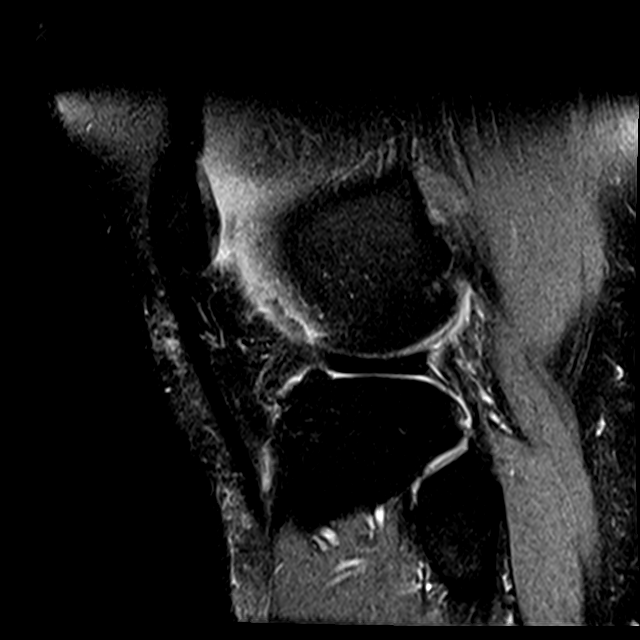
[im 27/27]
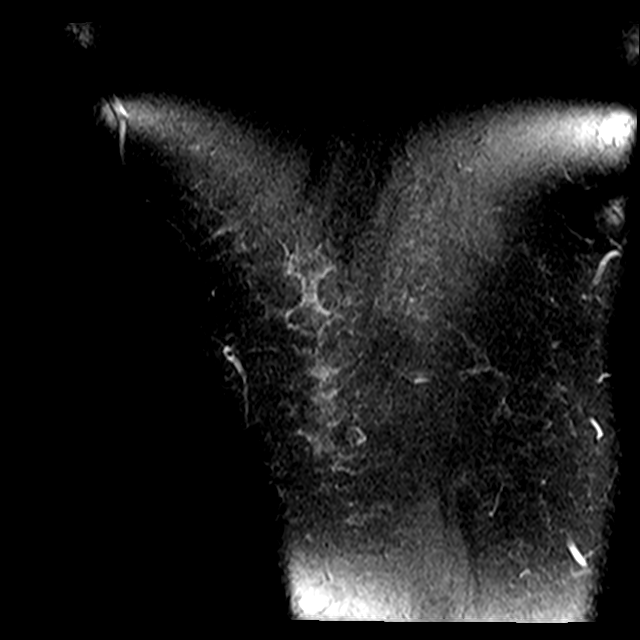

[Series 7: T2 fat-sat · coronal · 3.2mm · 0.62mm/px · 8 of 28 slices shown]
[im 1/28]
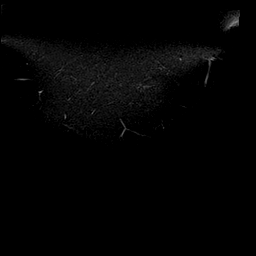
[im 4/28]
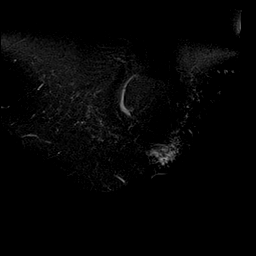
[im 8/28]
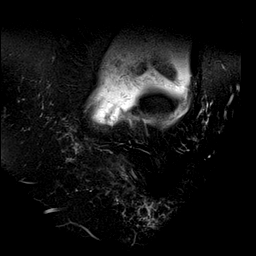
[im 12/28]
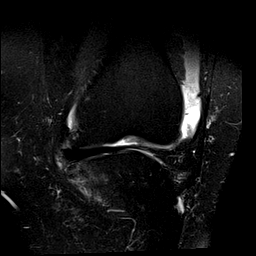
[im 16/28]
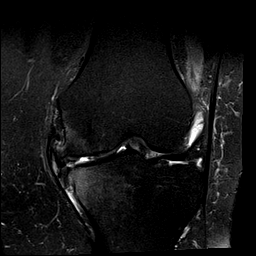
[im 20/28]
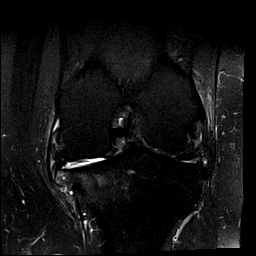
[im 24/28]
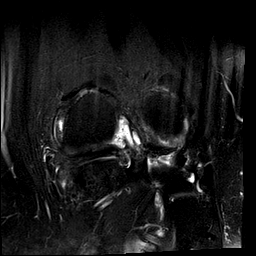
[im 28/28]
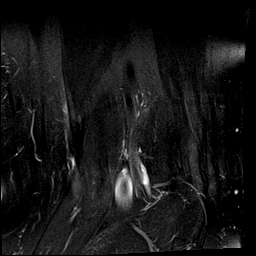

[Series 8: T1 · coronal · 3.2mm · 0.25mm/px · 3 of 28 slices shown]
[im 4/28]
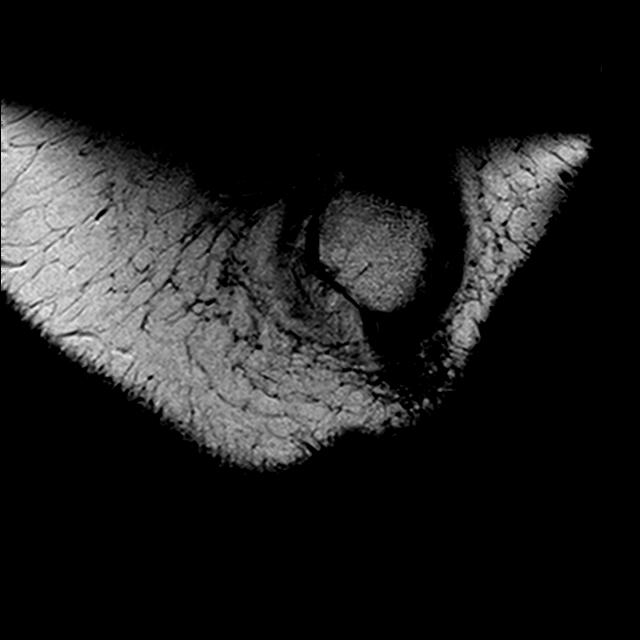
[im 16/28]
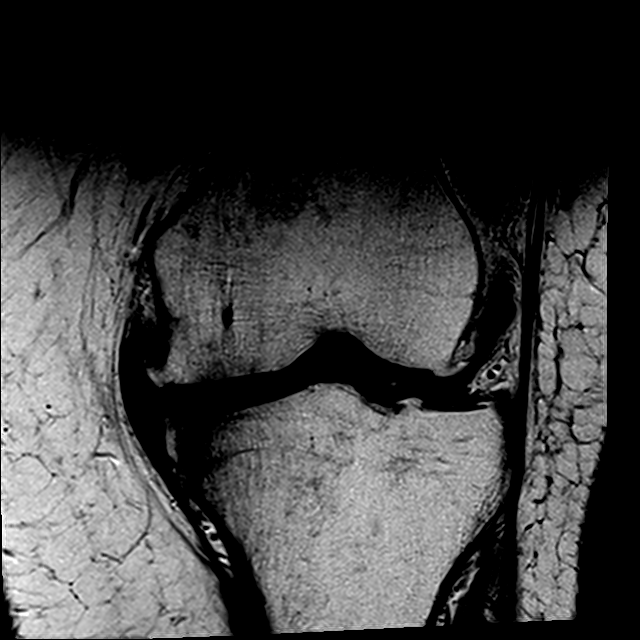
[im 24/28]
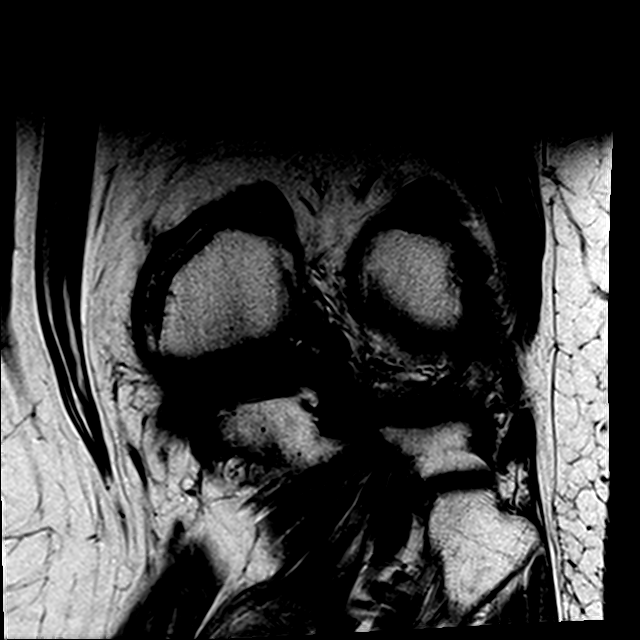

[21 of 40 positions shown; findings below may reference images not displayed]

FINDINGS: MENISCI

Medial meniscus: Severely degenerated and torn in the posterior horn
and midbody regions.

Lateral meniscus:  Intact.

LIGAMENTS

Cruciates: Severe mucoid degeneration versus chronic tear. The PCL
is intact.

Collaterals:  Intact.  MCL and pes anserine bursitis.

CARTILAGE

Patellofemoral: Significant chondromalacia involving the medial
facet.

Medial: Severe degenerative chondrosis with areas of full thickness
cartilage loss along with joint space narrowing and osteophytic
spurring with subchondral marrow edema.

Lateral: Moderate degenerative chondrosis with joint space narrowing
and osteophytic spurring.

Joint:  Moderate-sized joint effusion with moderate synovitis.

Popliteal Fossa:  No popliteal mass or Baker's cyst.

Extensor Mechanism: The patella retinacular structures are intact
and the quadriceps and patellar tendons are intact.

Bones: Tricompartmental degenerative changes with stress edema in
the medial tibia and medial femur. No acute abnormality or
osteochondral lesion.
IMPRESSION: 1. Significant tricompartmental degenerative changes most notable in
the medial compartment.
2. Severely degenerated and torn medial meniscus.
3. Severe mucoid degeneration of the ACL versus chronic partial
tear.
4. Moderate-sized joint effusion and moderate synovitis.
